# Patient Record
Sex: Female | Born: 1960 | Race: White | Hispanic: No | Marital: Married | Smoking: Current every day smoker
Health system: Southern US, Community
[De-identification: ages and names within clinical notes are randomized; demographics above are authoritative.]

## PROBLEM LIST (undated history)

## (undated) DIAGNOSIS — R519 Headache, unspecified: Secondary | ICD-10-CM

## (undated) DIAGNOSIS — A481 Legionnaires' disease: Secondary | ICD-10-CM

## (undated) DIAGNOSIS — Z9889 Other specified postprocedural states: Secondary | ICD-10-CM

## (undated) DIAGNOSIS — I10 Essential (primary) hypertension: Secondary | ICD-10-CM

## (undated) DIAGNOSIS — M47812 Spondylosis without myelopathy or radiculopathy, cervical region: Secondary | ICD-10-CM

## (undated) DIAGNOSIS — F419 Anxiety disorder, unspecified: Secondary | ICD-10-CM

## (undated) DIAGNOSIS — R51 Headache: Principal | ICD-10-CM

## (undated) DIAGNOSIS — E119 Type 2 diabetes mellitus without complications: Secondary | ICD-10-CM

## (undated) HISTORY — DX: Headache: R51

## (undated) HISTORY — DX: Headache, unspecified: R51.9

## (undated) HISTORY — DX: Anxiety disorder, unspecified: F41.9

## (undated) HISTORY — DX: Legionnaires' disease: A48.1

## (undated) HISTORY — PX: APPENDECTOMY: SHX54

## (undated) HISTORY — PX: ABDOMINAL HYSTERECTOMY: SHX81

## (undated) HISTORY — DX: Other specified postprocedural states: Z98.890

## (undated) HISTORY — DX: Spondylosis without myelopathy or radiculopathy, cervical region: M47.812

## (undated) HISTORY — DX: Essential (primary) hypertension: I10

## (undated) HISTORY — DX: Type 2 diabetes mellitus without complications: E11.9

---

## 1998-06-23 ENCOUNTER — Emergency Department (HOSPITAL_COMMUNITY): Admission: EM | Admit: 1998-06-23 | Discharge: 1998-06-23 | Payer: Self-pay | Admitting: Emergency Medicine

## 1998-06-23 ENCOUNTER — Encounter: Payer: Self-pay | Admitting: Emergency Medicine

## 2000-08-30 ENCOUNTER — Encounter: Payer: Self-pay | Admitting: Emergency Medicine

## 2000-08-30 ENCOUNTER — Emergency Department (HOSPITAL_COMMUNITY): Admission: EM | Admit: 2000-08-30 | Discharge: 2000-08-30 | Payer: Self-pay | Admitting: Emergency Medicine

## 2002-06-27 ENCOUNTER — Encounter: Payer: Self-pay | Admitting: Internal Medicine

## 2002-06-27 ENCOUNTER — Ambulatory Visit (HOSPITAL_COMMUNITY): Admission: RE | Admit: 2002-06-27 | Discharge: 2002-06-27 | Payer: Self-pay | Admitting: Internal Medicine

## 2002-09-26 ENCOUNTER — Encounter: Payer: Self-pay | Admitting: Internal Medicine

## 2002-09-26 ENCOUNTER — Inpatient Hospital Stay (HOSPITAL_COMMUNITY): Admission: AD | Admit: 2002-09-26 | Discharge: 2002-09-28 | Payer: Self-pay | Admitting: Internal Medicine

## 2002-09-27 ENCOUNTER — Encounter: Payer: Self-pay | Admitting: Internal Medicine

## 2002-10-17 ENCOUNTER — Encounter: Payer: Self-pay | Admitting: Internal Medicine

## 2002-10-17 ENCOUNTER — Ambulatory Visit (HOSPITAL_COMMUNITY): Admission: RE | Admit: 2002-10-17 | Discharge: 2002-10-17 | Payer: Self-pay | Admitting: Internal Medicine

## 2005-05-18 ENCOUNTER — Encounter: Admission: RE | Admit: 2005-05-18 | Discharge: 2005-05-18 | Payer: Self-pay | Admitting: Internal Medicine

## 2006-05-24 ENCOUNTER — Ambulatory Visit (HOSPITAL_BASED_OUTPATIENT_CLINIC_OR_DEPARTMENT_OTHER): Admission: RE | Admit: 2006-05-24 | Discharge: 2006-05-24 | Payer: Self-pay | Admitting: Orthopedic Surgery

## 2013-05-23 ENCOUNTER — Other Ambulatory Visit: Payer: Self-pay | Admitting: Internal Medicine

## 2013-05-23 ENCOUNTER — Ambulatory Visit
Admission: RE | Admit: 2013-05-23 | Discharge: 2013-05-23 | Disposition: A | Discharge status: Home / Self Care | Payer: 59 | Source: Ambulatory Visit | Attending: Internal Medicine | Admitting: Internal Medicine

## 2013-05-23 DIAGNOSIS — R05 Cough: Secondary | ICD-10-CM

## 2013-05-23 DIAGNOSIS — R059 Cough, unspecified: Secondary | ICD-10-CM

## 2014-10-01 ENCOUNTER — Ambulatory Visit (INDEPENDENT_AMBULATORY_CARE_PROVIDER_SITE_OTHER): Payer: 59 | Admitting: Neurology

## 2014-10-01 ENCOUNTER — Encounter: Payer: Self-pay | Admitting: Neurology

## 2014-10-01 VITALS — BP 131/72 | HR 95 | Ht 63.0 in | Wt 153.8 lb

## 2014-10-01 DIAGNOSIS — S161XXA Strain of muscle, fascia and tendon at neck level, initial encounter: Secondary | ICD-10-CM

## 2014-10-01 DIAGNOSIS — M47812 Spondylosis without myelopathy or radiculopathy, cervical region: Secondary | ICD-10-CM | POA: Diagnosis not present

## 2014-10-01 DIAGNOSIS — R51 Headache: Secondary | ICD-10-CM | POA: Diagnosis not present

## 2014-10-01 DIAGNOSIS — R519 Headache, unspecified: Secondary | ICD-10-CM

## 2014-10-01 HISTORY — DX: Spondylosis without myelopathy or radiculopathy, cervical region: M47.812

## 2014-10-01 HISTORY — DX: Headache, unspecified: R51.9

## 2014-10-01 MED ORDER — TIZANIDINE HCL 2 MG PO TABS: 2.0000 mg | ORAL_TABLET | Freq: Three times a day (TID) | ORAL | Status: AC

## 2014-10-01 NOTE — Patient Instructions (Signed)

## 2014-10-01 NOTE — Progress Notes (Signed)
Reason for visit: Neck pain and stiffness  Referring physician: Dr. Burton Apley  Tiffany Steele is a 54 y.o. female  History of present illness:  Tiffany Steele is a 54 year old left-handed white female with a history of chronic daily headaches. The headaches have been present since she was 54 years old, primarily on the right side the head, but the headaches may spread throughout the head. She has denied any neck discomfort until the end of May 2016. She began having sudden jolts of numbness and paralysis of the head and neck that would last a few moments, and then leave her with soreness of the neck lasting for 5 hours. The patient had up to 10 such events, but she has not had any over the last 4 weeks. Instead, the patient has had persistent soreness and stiffness of the neck that is worse at the base of the skull. The patient denies any pain down the arms, she had times will feel "weak all over". She has some slight balance issues. She denies any issues controlling the bowels or the bladder, but she does have chronic diarrhea that has been present for several months. She denies any visual loss or visual blurring, she denies loss of consciousness. She comes to this office for further evaluation of the above events. She takes alprazolam and hydrocodone daily for her chronic headaches. The patient does report some crepitus in the neck with turning the head.  Past Medical History  Diagnosis Date  . Hypertension   . Diabetes mellitus without complication   . Headache   . Anxiety   . S/P wrist surgery   . Legionnaire's disease   . Chronic daily headache 10/01/2014  . Cervical spondylosis without myelopathy 10/01/2014    Past Surgical History  Procedure Laterality Date  . Abdominal hysterectomy    . Appendectomy      Family History  Problem Relation Age of Onset  . Colon cancer Mother   . Diabetes Mother   . Heart attack Father   . Cancer Father   . Diabetes Sister   . Kidney  disease Brother   . Diabetes Brother   . Breast cancer Maternal Grandmother   . Migraines Maternal Grandfather   . Congestive Heart Failure Maternal Grandfather   . Alzheimer's disease Maternal Grandfather     Social history:  reports that she has been smoking.  She has never used smokeless tobacco. She reports that she does not drink alcohol or use illicit drugs.  Medications:  Prior to Admission medications   Medication Sig Start Date End Date Taking? Authorizing Provider  ALPRAZolam Prudy Feeler) 0.5 MG tablet Take 0.5 mg by mouth 2 (two) times daily as needed for anxiety.   Yes Historical Provider, MD  benzonatate (TESSALON) 100 MG capsule Take 100 mg by mouth 2 (two) times daily as needed for cough.   Yes Historical Provider, MD  HYDROcodone-acetaminophen (NORCO) 7.5-325 MG per tablet Take 1 tablet by mouth every 8 (eight) hours as needed for moderate pain.   Yes Historical Provider, MD  lisinopril-hydrochlorothiazide (PRINZIDE,ZESTORETIC) 10-12.5 MG per tablet Take 0.5 tablets by mouth daily.   Yes Historical Provider, MD  sulindac (CLINORIL) 200 MG tablet Take 200 mg by mouth 2 (two) times daily.   Yes Historical Provider, MD     No Known Allergies  ROS:  Out of a complete 14 system review of symptoms, the patient complains only of the following symptoms, and all other reviewed systems are negative.  Fatigue Blurred  vision Diarrhea Feeling hot, increased thirst, flushing Joint pain, achy muscles Allergies, runny nose Headache, numbness, weakness, passing out Anxiety, decreased energy, disinterest in activities, racing thoughts Sleepiness  Blood pressure 131/72, pulse 95, height 5\' 3"  (1.6 m), weight 153 lb 12.8 oz (69.763 kg).  Physical Exam  General: The patient is alert and cooperative at the time of the examination. The patient is markedly obese.  Eyes: Pupils are equal, round, and reactive to light. Discs are flat bilaterally.  Neck: The neck is supple, no carotid  bruits are noted.  Respiratory: The respiratory examination is clear.  Cardiovascular: The cardiovascular examination reveals a regular rate and rhythm, no obvious murmurs or rubs are noted.  Neuromuscular: Range of movement of the cervical spine lacks about 10 of full lateral rotation bilaterally. No crepitus is noted in the temporomandibular joints.  Skin: Extremities are without significant edema.  Neurologic Exam  Mental status: The patient is alert and oriented x 3 at the time of the examination. The patient has apparent normal recent and remote memory, with an apparently normal attention span and concentration ability.  Cranial nerves: Facial symmetry is present. There is good sensation of the face to pinprick and soft touch on the left face, decreased on the right. Vibration sensation, there is a decrease on the left forehead relative to the right. The strength of the facial muscles and the muscles to head turning and shoulder shrug are normal bilaterally. Speech is well enunciated, no aphasia or dysarthria is noted. Extraocular movements are full. Visual fields are full. The tongue is midline, and the patient has symmetric elevation of the soft palate. No obvious hearing deficits are noted.  Motor: The motor testing reveals 5 over 5 strength of all 4 extremities. Good symmetric motor tone is noted throughout.  Sensory: Sensory testing is intact to pinprick, soft touch, vibration sensation, and position sense on all 4 extremities, with exception of some decrease in pinprick sensation on the right arm and leg. No evidence of extinction is noted.  Coordination: Cerebellar testing reveals good finger-nose-finger and heel-to-shin bilaterally.  Gait and station: Gait is normal. Tandem gait is normal. Romberg is negative. No drift is seen.  Reflexes: Deep tendon reflexes are symmetric and normal bilaterally, with exception that the left ankle jerk reflex was decreased. Toes are downgoing  bilaterally.   Assessment/Plan:  1. Chronic daily headache  2. Cervical strain  Clinical examination is relatively unremarkable. The patient reports crepitus in the neck and some chronic soreness of the cervical paraspinal muscles. The patient will be placed on tizanidine, and she will be set up for neuromuscular therapy. If the symptoms worsen in the future, MRI evaluation of the cervical spine can be done. She will follow-up in about 3 months.  Marlan Palau. Keith Deaunna Olarte MD 10/01/2014 7:49 PM  [facility] [address] [place], [place] [id number]  Phone [phone] Fax [phone]

## 2015-01-01 ENCOUNTER — Ambulatory Visit: Payer: 59 | Admitting: Adult Health

## 2015-06-29 ENCOUNTER — Other Ambulatory Visit: Payer: Self-pay | Admitting: Neurology

## 2016-09-20 ENCOUNTER — Ambulatory Visit
Admission: RE | Admit: 2016-09-20 | Discharge: 2016-09-20 | Disposition: A | Discharge status: Home / Self Care | Payer: 59 | Source: Ambulatory Visit | Attending: Internal Medicine | Admitting: Internal Medicine

## 2016-09-20 ENCOUNTER — Other Ambulatory Visit: Payer: Self-pay | Admitting: Internal Medicine

## 2016-09-20 DIAGNOSIS — R05 Cough: Secondary | ICD-10-CM

## 2016-09-20 DIAGNOSIS — R059 Cough, unspecified: Secondary | ICD-10-CM

## 2018-07-31 ENCOUNTER — Ambulatory Visit
Admission: RE | Admit: 2018-07-31 | Discharge: 2018-07-31 | Disposition: A | Discharge status: Home / Self Care | Payer: 59 | Source: Ambulatory Visit | Attending: Internal Medicine | Admitting: Internal Medicine

## 2018-07-31 ENCOUNTER — Other Ambulatory Visit: Payer: Self-pay | Admitting: Internal Medicine

## 2018-07-31 DIAGNOSIS — M79644 Pain in right finger(s): Secondary | ICD-10-CM

## 2020-01-16 ENCOUNTER — Ambulatory Visit
Admission: RE | Admit: 2020-01-16 | Discharge: 2020-01-16 | Disposition: A | Discharge status: Home / Self Care | Payer: 59 | Source: Ambulatory Visit | Attending: Internal Medicine | Admitting: Internal Medicine

## 2020-01-16 ENCOUNTER — Other Ambulatory Visit: Payer: Self-pay | Admitting: Internal Medicine

## 2020-01-16 DIAGNOSIS — R059 Cough, unspecified: Secondary | ICD-10-CM

## 2020-01-16 DIAGNOSIS — M542 Cervicalgia: Secondary | ICD-10-CM

## 2020-02-12 ENCOUNTER — Other Ambulatory Visit: Payer: Self-pay | Admitting: Internal Medicine

## 2020-02-12 DIAGNOSIS — R059 Cough, unspecified: Secondary | ICD-10-CM

## 2020-02-29 ENCOUNTER — Other Ambulatory Visit: Payer: 59

## 2020-03-03 ENCOUNTER — Ambulatory Visit
Admission: RE | Admit: 2020-03-03 | Discharge: 2020-03-03 | Disposition: A | Discharge status: Home / Self Care | Payer: 59 | Source: Ambulatory Visit | Attending: Internal Medicine | Admitting: Internal Medicine

## 2020-03-03 ENCOUNTER — Other Ambulatory Visit: Payer: 59

## 2020-03-03 DIAGNOSIS — R059 Cough, unspecified: Secondary | ICD-10-CM

## 2020-03-03 MED ORDER — IOPAMIDOL (ISOVUE-300) INJECTION 61%
75.0000 mL | Freq: Once | INTRAVENOUS | Status: AC | PRN
  Administered 2020-03-03: 75 mL via INTRAVENOUS

## 2020-09-24 ENCOUNTER — Other Ambulatory Visit: Payer: Self-pay | Admitting: Internal Medicine

## 2020-09-24 DIAGNOSIS — R7989 Other specified abnormal findings of blood chemistry: Secondary | ICD-10-CM

## 2020-10-10 ENCOUNTER — Other Ambulatory Visit: Payer: 59

## 2021-11-21 IMAGING — CT CT CHEST W/ CM
2 of 4 series · 13 of 36 positions shown, 16 images · IV contrast (iopamidol)
Comparison: Chest radiographs 01/16/2020

CLINICAL DATA: Cough.  Abnormal chest radiographs.  Smoker.

Creatinine was obtained on site at [HOSPITAL] at [REDACTED].
Results: Creatinine 1.1 mg/dL.
EXAM:
CT CHEST WITH CONTRAST
TECHNIQUE: Multidetector CT imaging of the chest was performed during
intravenous contrast administration.
CONTRAST:  75mL [id number] IOPAMIDOL ([id number]) INJECTION 61%

[Series 2: chest 2.00 br40 s3 · axial · 0.61mm/px · z∈[+1493,+1729]mm · 10 of 140 slices shown, 13 images (1 of 2)]
[im 11/140  mediastinal]
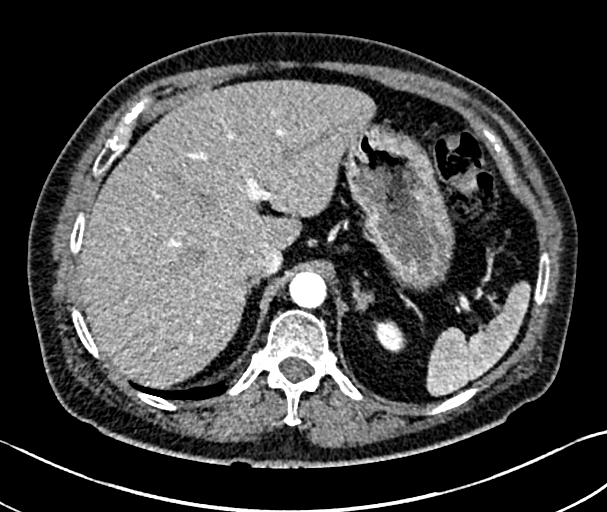
[im 11/140  lung]
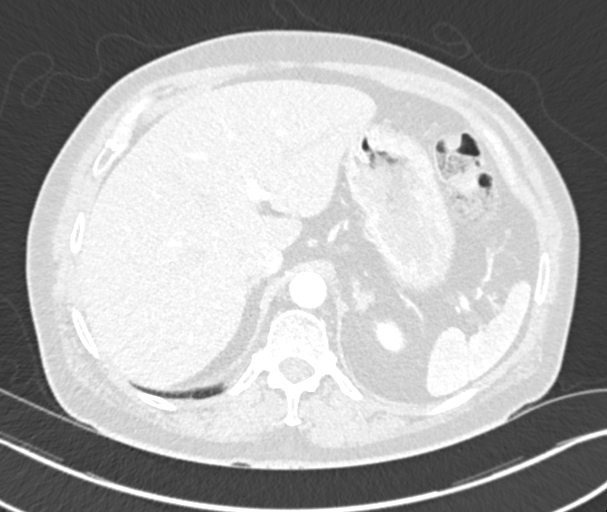
[im 22/140  lung]
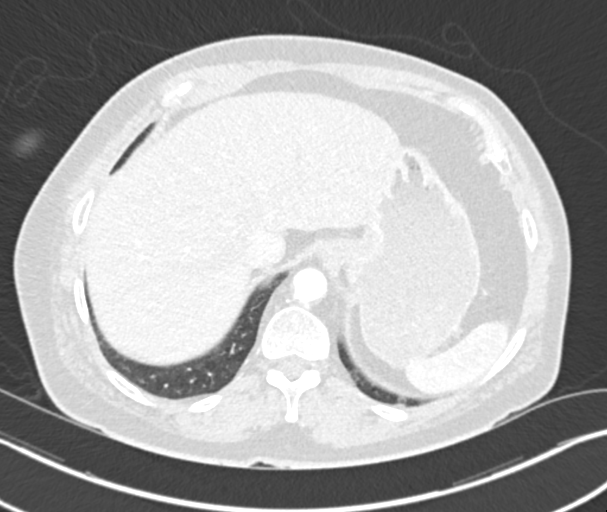
[im 43/140  lung]
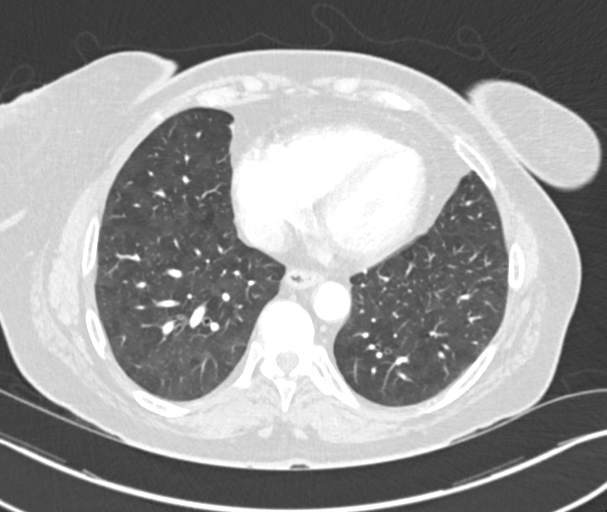
[im 54/140  lung]
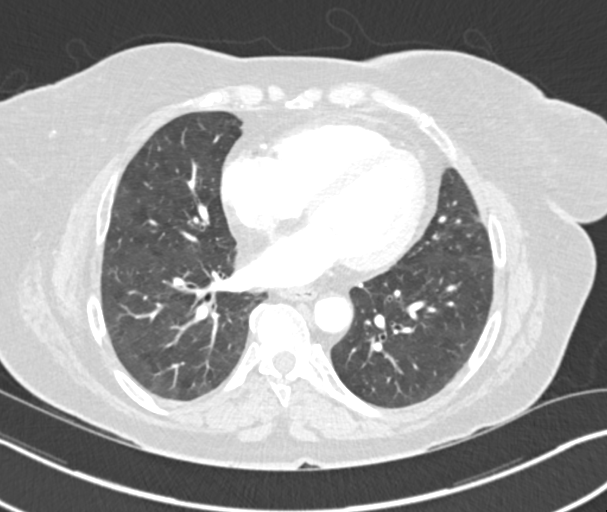
[im 65/140  mediastinal]
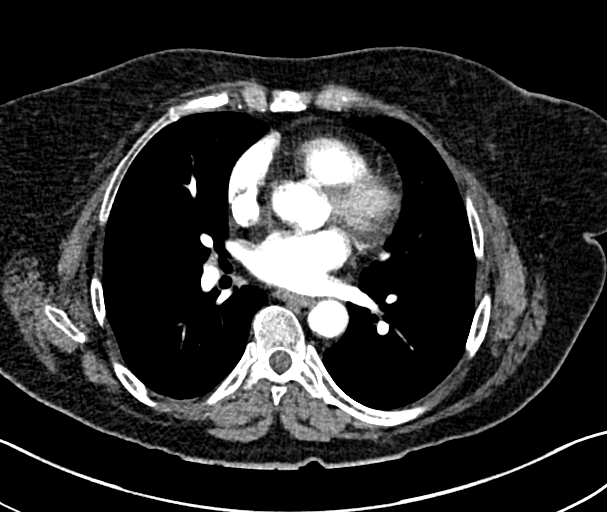
[im 65/140  lung]
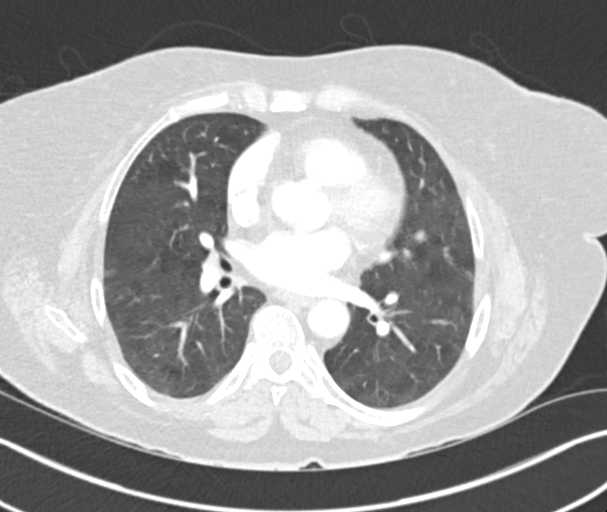
[im 75/140  lung]
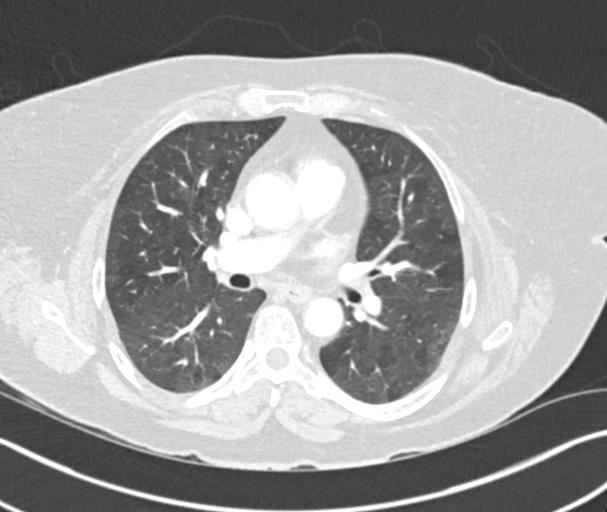
[im 86/140  lung]
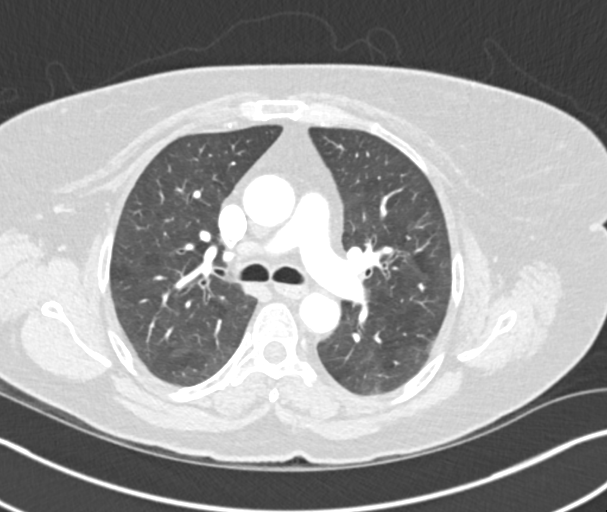
[im 107/140  lung]
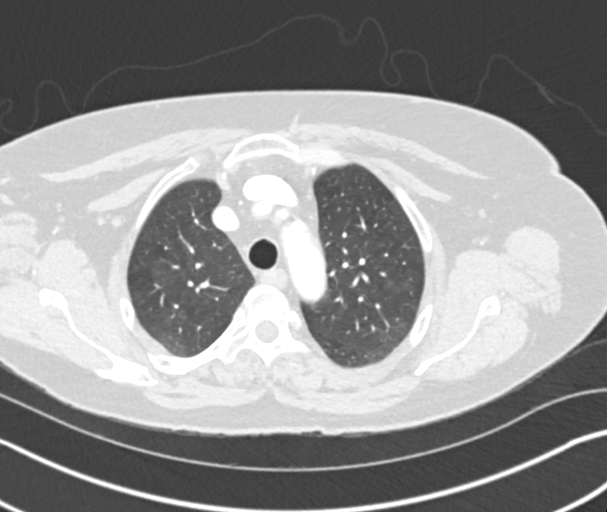
[im 118/140  mediastinal]
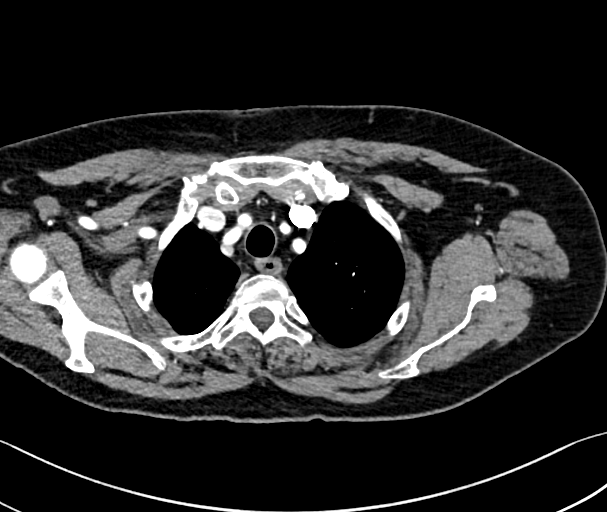
[im 118/140  lung]
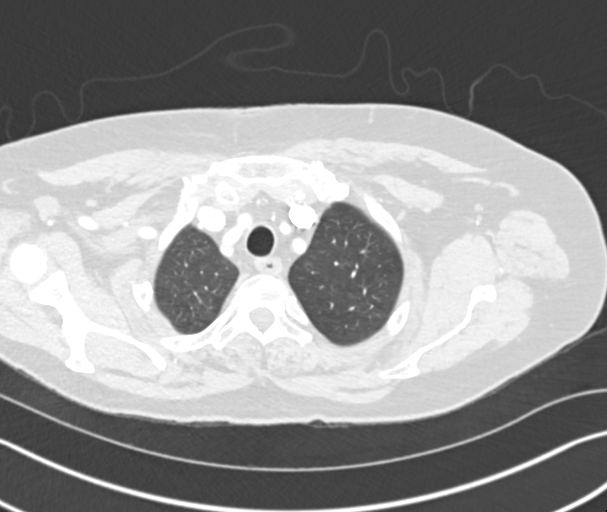
[im 129/140  lung]
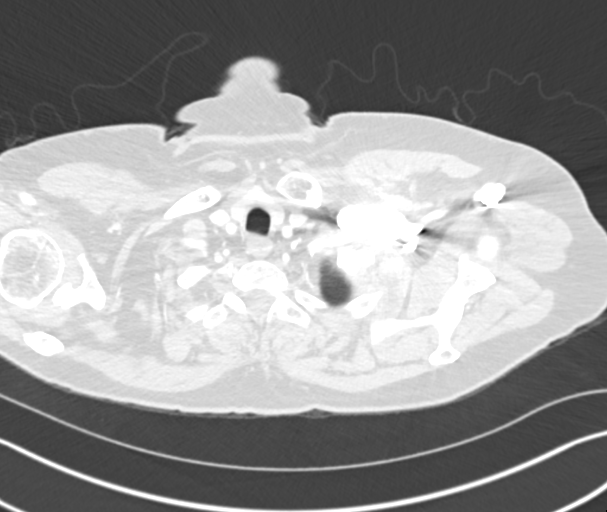

[Series 4: chest 2.00 br40 s3 · coronal · 0.55mm/px · 3 of 153 slices shown (2 of 2)]
[im 31/153  lung]
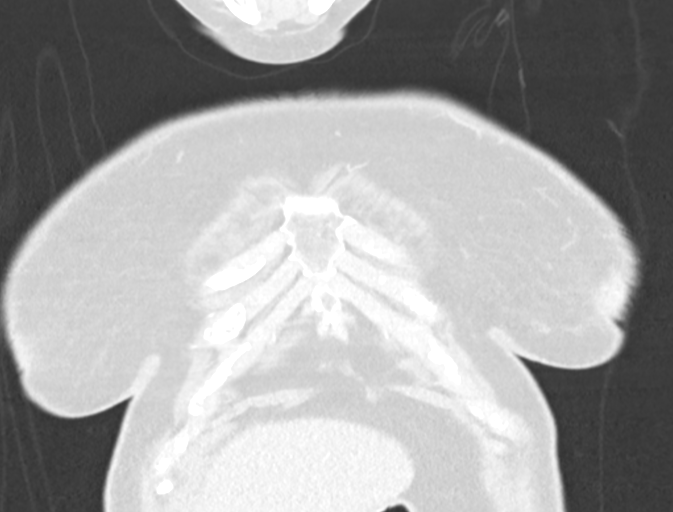
[im 61/153  lung]
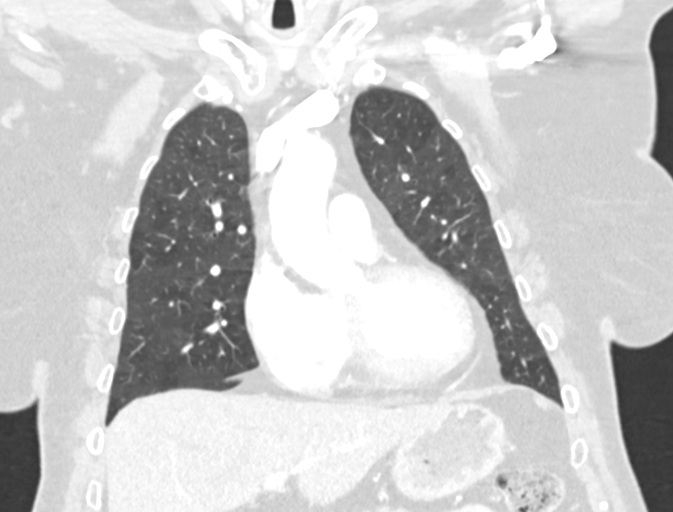
[im 92/153  lung]
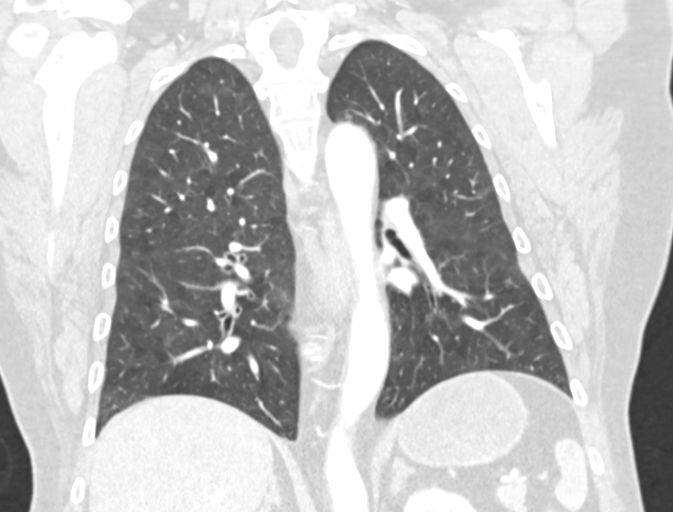

[13 of 36 positions shown; findings below may reference images not displayed]

FINDINGS: Cardiovascular: Thoracic aortic atherosclerosis without aneurysm. No
evidence of a central pulmonary embolus on this nondedicated study.
Normal heart size. No pericardial effusion.

Mediastinum/Nodes: No enlarged axillary, mediastinal, or hilar lymph
nodes. Unremarkable thyroid and esophagus.

Lungs/Pleura: No pleural effusion or pneumothorax. Mild-to-moderate
mosaic lung attenuation diffusely with mild peribronchial
thickening. No lung mass or consolidation. No abnormality identified
to correspond to the right infrahilar opacity on chest radiographs.

Upper Abdomen: Unremarkable.

Musculoskeletal: No acute osseous abnormality or suspicious osseous
lesion. Mild thoracic spondylosis.
IMPRESSION: 1. No lung mass or consolidation. No abnormality identified to
correspond to the right infrahilar opacity on chest radiographs.
2. Mosaic lung attenuation, nonspecific but may reflect small
airways disease given the presence of bronchial wall thickening.
3. Aortic Atherosclerosis ([id number]).
